# Patient Record
Sex: Male | Born: 1960 | Race: Black or African American | Hispanic: No | Smoking: Never smoker
Health system: Southern US, Community
[De-identification: ages and names within clinical notes are randomized; demographics above are authoritative.]

## PROBLEM LIST (undated history)

## (undated) DIAGNOSIS — K859 Acute pancreatitis without necrosis or infection, unspecified: Secondary | ICD-10-CM

## (undated) DIAGNOSIS — E119 Type 2 diabetes mellitus without complications: Secondary | ICD-10-CM

## (undated) DIAGNOSIS — K861 Other chronic pancreatitis: Secondary | ICD-10-CM

## (undated) DIAGNOSIS — K219 Gastro-esophageal reflux disease without esophagitis: Secondary | ICD-10-CM

## (undated) HISTORY — DX: Other chronic pancreatitis: K86.1

## (undated) HISTORY — PX: BACK SURGERY: SHX140

---

## 1996-02-29 ENCOUNTER — Encounter: Payer: Self-pay | Admitting: Internal Medicine

## 2000-02-06 ENCOUNTER — Emergency Department (HOSPITAL_COMMUNITY): Admission: EM | Admit: 2000-02-06 | Discharge: 2000-02-06 | Payer: Self-pay | Admitting: Emergency Medicine

## 2005-12-25 ENCOUNTER — Ambulatory Visit: Payer: Self-pay | Admitting: Internal Medicine

## 2006-03-24 ENCOUNTER — Ambulatory Visit: Payer: Self-pay | Admitting: Internal Medicine

## 2006-03-30 ENCOUNTER — Ambulatory Visit: Payer: Self-pay | Admitting: Internal Medicine

## 2007-03-30 ENCOUNTER — Encounter: Payer: Self-pay | Admitting: *Deleted

## 2007-03-30 DIAGNOSIS — K219 Gastro-esophageal reflux disease without esophagitis: Secondary | ICD-10-CM

## 2007-03-30 DIAGNOSIS — M722 Plantar fascial fibromatosis: Secondary | ICD-10-CM | POA: Insufficient documentation

## 2007-03-30 DIAGNOSIS — E7800 Pure hypercholesterolemia, unspecified: Secondary | ICD-10-CM

## 2007-07-08 ENCOUNTER — Encounter (INDEPENDENT_AMBULATORY_CARE_PROVIDER_SITE_OTHER): Payer: Self-pay | Admitting: *Deleted

## 2007-07-08 ENCOUNTER — Emergency Department (HOSPITAL_COMMUNITY): Admission: EM | Admit: 2007-07-08 | Discharge: 2007-07-08 | Payer: Self-pay | Admitting: Emergency Medicine

## 2007-12-07 ENCOUNTER — Ambulatory Visit (HOSPITAL_COMMUNITY): Admission: RE | Admit: 2007-12-07 | Discharge: 2007-12-08 | Payer: Self-pay | Admitting: Neurosurgery

## 2008-07-31 ENCOUNTER — Ambulatory Visit: Payer: Self-pay | Admitting: Internal Medicine

## 2008-07-31 DIAGNOSIS — I10 Essential (primary) hypertension: Secondary | ICD-10-CM | POA: Insufficient documentation

## 2008-07-31 DIAGNOSIS — E785 Hyperlipidemia, unspecified: Secondary | ICD-10-CM | POA: Insufficient documentation

## 2008-07-31 DIAGNOSIS — J309 Allergic rhinitis, unspecified: Secondary | ICD-10-CM | POA: Insufficient documentation

## 2008-07-31 DIAGNOSIS — K5289 Other specified noninfective gastroenteritis and colitis: Secondary | ICD-10-CM | POA: Insufficient documentation

## 2009-02-03 ENCOUNTER — Ambulatory Visit: Payer: Self-pay | Admitting: Diagnostic Radiology

## 2009-02-03 ENCOUNTER — Ambulatory Visit (HOSPITAL_COMMUNITY): Admission: RE | Admit: 2009-02-03 | Discharge: 2009-02-03 | Payer: Self-pay | Admitting: Emergency Medicine

## 2009-02-03 ENCOUNTER — Encounter (INDEPENDENT_AMBULATORY_CARE_PROVIDER_SITE_OTHER): Payer: Self-pay | Admitting: *Deleted

## 2009-02-03 ENCOUNTER — Emergency Department (HOSPITAL_BASED_OUTPATIENT_CLINIC_OR_DEPARTMENT_OTHER): Admission: EM | Admit: 2009-02-03 | Discharge: 2009-02-03 | Payer: Self-pay | Admitting: Emergency Medicine

## 2009-02-04 ENCOUNTER — Inpatient Hospital Stay (HOSPITAL_COMMUNITY): Admission: EM | Admit: 2009-02-04 | Discharge: 2009-02-06 | Payer: Self-pay | Admitting: Emergency Medicine

## 2009-02-04 ENCOUNTER — Encounter (INDEPENDENT_AMBULATORY_CARE_PROVIDER_SITE_OTHER): Payer: Self-pay | Admitting: *Deleted

## 2009-02-04 ENCOUNTER — Ambulatory Visit: Payer: Self-pay | Admitting: Internal Medicine

## 2009-02-07 ENCOUNTER — Telehealth (INDEPENDENT_AMBULATORY_CARE_PROVIDER_SITE_OTHER): Payer: Self-pay | Admitting: *Deleted

## 2009-02-12 ENCOUNTER — Telehealth: Payer: Self-pay | Admitting: Internal Medicine

## 2009-02-14 ENCOUNTER — Ambulatory Visit: Payer: Self-pay | Admitting: Internal Medicine

## 2009-02-14 DIAGNOSIS — K861 Other chronic pancreatitis: Secondary | ICD-10-CM | POA: Insufficient documentation

## 2009-02-14 DIAGNOSIS — R252 Cramp and spasm: Secondary | ICD-10-CM | POA: Insufficient documentation

## 2009-02-14 HISTORY — DX: Other chronic pancreatitis: K86.1

## 2009-02-14 LAB — CONVERTED CEMR LAB
Cholesterol, target level: 200 mg/dL
HDL goal, serum: 40 mg/dL
LDL Goal: 130 mg/dL

## 2009-03-26 ENCOUNTER — Ambulatory Visit: Payer: Self-pay | Admitting: Internal Medicine

## 2009-03-27 ENCOUNTER — Telehealth (INDEPENDENT_AMBULATORY_CARE_PROVIDER_SITE_OTHER): Payer: Self-pay | Admitting: *Deleted

## 2009-03-27 ENCOUNTER — Encounter: Payer: Self-pay | Admitting: Gastroenterology

## 2009-04-12 ENCOUNTER — Ambulatory Visit: Payer: Self-pay | Admitting: Gastroenterology

## 2009-04-12 ENCOUNTER — Ambulatory Visit (HOSPITAL_COMMUNITY): Admission: RE | Admit: 2009-04-12 | Discharge: 2009-04-12 | Payer: Self-pay | Admitting: Gastroenterology

## 2009-04-13 ENCOUNTER — Encounter: Payer: Self-pay | Admitting: Gastroenterology

## 2009-04-13 ENCOUNTER — Telehealth (INDEPENDENT_AMBULATORY_CARE_PROVIDER_SITE_OTHER): Payer: Self-pay | Admitting: *Deleted

## 2009-04-26 ENCOUNTER — Ambulatory Visit (HOSPITAL_COMMUNITY): Admission: RE | Admit: 2009-04-26 | Discharge: 2009-04-26 | Payer: Self-pay | Admitting: Gastroenterology

## 2009-04-26 ENCOUNTER — Encounter: Payer: Self-pay | Admitting: Gastroenterology

## 2009-06-22 ENCOUNTER — Telehealth (INDEPENDENT_AMBULATORY_CARE_PROVIDER_SITE_OTHER): Payer: Self-pay | Admitting: *Deleted

## 2010-10-12 LAB — COMPREHENSIVE METABOLIC PANEL
ALT: 17 U/L (ref 0–53)
ALT: 19 U/L (ref 0–53)
AST: 14 U/L (ref 0–37)
AST: 15 U/L (ref 0–37)
AST: 16 U/L (ref 0–37)
Albumin: 3.8 g/dL (ref 3.5–5.2)
BUN: 9 mg/dL (ref 6–23)
CO2: 27 mEq/L (ref 19–32)
CO2: 29 mEq/L (ref 19–32)
CO2: 31 mEq/L (ref 19–32)
Calcium: 8.9 mg/dL (ref 8.4–10.5)
Calcium: 8.9 mg/dL (ref 8.4–10.5)
Chloride: 103 mEq/L (ref 96–112)
Chloride: 105 mEq/L (ref 96–112)
Chloride: 105 mEq/L (ref 96–112)
Creatinine, Ser: 1.04 mg/dL (ref 0.4–1.5)
Creatinine, Ser: 1.08 mg/dL (ref 0.4–1.5)
GFR calc non Af Amer: >60 mL/min (ref >60)
GFR calc non Af Amer: >60 mL/min (ref >60)
GFR calc non Af Amer: >60 mL/min (ref >60)
Glucose, Bld: 101 mg/dL — ABNORMAL HIGH (ref 70–99)
Glucose, Bld: 94 mg/dL (ref 70–99)
Potassium: 4.3 mEq/L (ref 3.5–5.1)
Sodium: 138 mEq/L (ref 135–145)
Sodium: 139 mEq/L (ref 135–145)
Total Bilirubin: 0.7 mg/dL (ref 0.3–1.2)
Total Bilirubin: 0.9 mg/dL (ref 0.3–1.2)
Total Bilirubin: 1.4 mg/dL — ABNORMAL HIGH (ref 0.3–1.2)

## 2010-10-12 LAB — CBC
HCT: 40.2 % (ref 39.0–52.0)
HCT: 40.9 % (ref 39.0–52.0)
Hemoglobin: 13.3 g/dL (ref 13.0–17.0)
MCV: 86 fL (ref 78.0–100.0)
MCV: 86.3 fL (ref 78.0–100.0)
Platelets: 199 10*3/uL (ref 150–400)
Platelets: 209 10*3/uL (ref 150–400)
RBC: 4.66 MIL/uL (ref 4.22–5.81)
RBC: 4.76 MIL/uL (ref 4.22–5.81)
RDW: 13.3 % (ref 11.5–15.5)
WBC: 7.1 10*3/uL (ref 4.0–10.5)
WBC: 9 10*3/uL (ref 4.0–10.5)
WBC: 9.4 10*3/uL (ref 4.0–10.5)

## 2010-10-12 LAB — POCT CARDIAC MARKERS
CKMB, poc: 1.3 ng/mL (ref 1.0–8.0)
Myoglobin, poc: 64.6 ng/mL (ref 12–200)

## 2010-10-12 LAB — LIPASE, BLOOD: Lipase: 50 U/L (ref 11–59)

## 2010-10-12 LAB — LIPID PANEL

## 2010-10-13 LAB — DIFFERENTIAL
Eosinophils Relative: 3 % (ref 0–5)
Lymphocytes Relative: 26 % (ref 12–46)
Lymphs Abs: 2.2 10*3/uL (ref 0.7–4.0)
Monocytes Absolute: 0.5 10*3/uL (ref 0.1–1.0)

## 2010-10-13 LAB — CBC
MCV: 85.4 fL (ref 78.0–100.0)
Platelets: 214 10*3/uL (ref 150–400)
WBC: 8.6 10*3/uL (ref 4.0–10.5)

## 2010-10-13 LAB — POCT CARDIAC MARKERS
CKMB, poc: 1.9 ng/mL (ref 1.0–8.0)
Troponin i, poc: <0.05 ng/mL (ref 0.00–0.09)

## 2010-10-13 LAB — COMPREHENSIVE METABOLIC PANEL
ALT: 18 U/L (ref 0–53)
AST: 21 U/L (ref 0–37)
Alkaline Phosphatase: 71 U/L (ref 39–117)
CO2: 28 mEq/L (ref 19–32)
Calcium: 9.7 mg/dL (ref 8.4–10.5)
GFR calc non Af Amer: >60 mL/min (ref >60)
Potassium: 4.3 mEq/L (ref 3.5–5.1)
Sodium: 141 mEq/L (ref 135–145)

## 2010-10-13 LAB — URINALYSIS, ROUTINE W REFLEX MICROSCOPIC
Hgb urine dipstick: NEGATIVE
Nitrite: NEGATIVE
Specific Gravity, Urine: 1.029 (ref 1.005–1.030)
Urobilinogen, UA: 1 mg/dL (ref 0.0–1.0)
pH: 5.5 (ref 5.0–8.0)

## 2010-10-13 LAB — URINE CULTURE: Colony Count: NO GROWTH

## 2010-11-19 NOTE — Op Note (Signed)
[name], Hunter Jimenez               ACCOUNT NO.:  [id number]   MEDICAL RECORD NO.:  [id number]          PATIENT TYPE:  OIB   LOCATION:  3537                         FACILITY:  [facility]   PHYSICIAN:  Reinaldo Meeker, M.D. DATE OF BIRTH:  Jan 05, 1961   DATE OF PROCEDURE:  12/07/2007  DATE OF DISCHARGE:                               OPERATIVE REPORT   PREOPERATIVE DIAGNOSIS:  Herniated disk, L5-S1, right.   POSTOPERATIVE DIAGNOSIS:  Herniated disk, L5-S1, right.   PROCEDURE:  L5-S1 interlaminar laminotomy for excision of herniated disk  with operating microscope.   SECONDARY PROCEDURE:  Microdissection, L5-S1 disk and S1 nerve root.   SURGEON:  Reinaldo Meeker, M.D.   ASSISTANT:  Kathaleen Maser. Pool, M.D.   PROCEDURE IN DETAIL:  After placed in the prone position, the patient's  back was prepped and draped in the usual sterile fashion.  Localizing x-  ray was taken prior to incision to identify the appropriate level.  Midline incision was made above the spinous processes of L5 and S1.  Using Bovie cutting current, the incision was carried down the spinous  processes.  Subperiosteal dissection was then carried out on the right  side of the spinous processes and lamina, and self-retaining retractor  was placed for exposure.  X-ray showed approach to the appropriate  level.  Using a high-speed drill, the inferior one-third of the L5  lamina, medial one-third of the facet joint, and the superior one-third  of the S1 lamina were removed.  Residual bone and ligamentum flavum were  removed in a piecemeal fashion.  The microscope was draped, brought into  the field, and used for the remainder of the case.  Using  microdissection technique, the lateral aspect of the thecal sac and S1  nerve root were identified.  Further coagulation was carried out down to  the floor of the canal to identify the L5-S1 disk, which was found to be  remarkably herniated.  After coagulating on the annulus, fascia was  incised with a 15 blade.  Using pituitary rongeurs and curettes,  thorough disk space clean-out was carried out.  At the same time, great  care was taken to avoid injury to the neural elements, and this was  successfully done.  At this point, inspection was carried out in all  directions for any evidence of residual compression, and none could be  identified.  Large amounts of irrigation were carried out, and any  bleeding controlled with bipolar coagulation and Gelfoam.  The wound was  then closed in multiple layers of Vicryl on the muscle fascia and  subcutaneous and subcuticular tissues, and staples were placed on the  skin.  Sterile dressing was then applied, and the patient was then  extubated and taken to recovery room in stable condition.           ______________________________  Reinaldo Meeker, M.D.     ROK/MEDQ  D:  12/07/2007  T:  12/08/2007  Job:  [id number]

## 2010-11-19 NOTE — Discharge Summary (Signed)
NAMEDAEMIAN, GAHM               ACCOUNT NO.:  [id number]   MEDICAL RECORD NO.:  [id number]          PATIENT TYPE:  INP   LOCATION:  1513                         [facility]ACILITY[facility]  [facility]   PHYSICIAN:  Georgina Quint. Plotnikov, MDDATE OF BIRTH:  [birth year]   DATE OF ADMISSION:  02/04/2009  DATE OF DISCHARGE:  02/06/2009                               DISCHARGE SUMMARY   DISCHARGE DIAGNOSES:  1. Acute pancreatitis, unclear etiology.  Differential etiology      includes high fat diet versus angiotensin-converting enzyme      inhibitor side effect versus other.  Common causes of acute      pancreatitis were ruled out by history and tests.  May need      gastrointestinal consult if symptoms recur.  2. Abdominal pain due to problem #1, improved.  3. Hypertension.  4. Gastroesophageal reflux disease.   DISCHARGE MEDICATIONS:  1. Cozaar 100 mg daily.  2. Omeprazole 20 mg daily.  3. Vitamin D3 1000 units daily, start in 1 week.   SPECIAL INSTRUCTIONS:  Stop taking lisinopril.  Call if problems.   DISCHARGE DIET:  Low-sodium and low-fat diet.   FOLLOW-UP PLANS:  Dr. Jonny Ruiz next week.  Return to work to be established  by Dr. Jonny Ruiz next week.   HISTORY:  The patient is a 50 year old male who presented to [facility] with severe epigastric pain radiating to his back of 24-48 hours'  duration.  There was no nausea, vomiting.  For the details, please see  history and physical on February 04, 2009.   HOSPITAL COURSE:  During the course of hospitalization, the patient was  kept n.p.o. on IV fluids.  His problems were managed symptomatically.  His condition has improved.  He was able to tolerate diet, and had a  bowel movement on the day of discharge.   On the day of discharge his blood pressure was 104/63, heart rate 64,  respirations 18, temp 98.6, T-max 98, saturations 100% on room air.  ABDOMEN:  Soft, nontender, slightly sensitive in the epigastric area.  HEENT:  With moist mucosa.  HEART:   Regular.  LUNGS:  Clear.  LOWER EXTREMITIES:  Without edema.  He is alert, oriented, and cooperative.   LABORATORIES ON THE DAY OF DISCHARGE:  His hemoglobin is 13.3, white  count 7.1.  AST, ALT normal.  Alk phos 53, Sodium 139, potassium 4.0  His EKG showed normal sinus rhythm.  His abdominal ultrasound was  normal.  Cardiac markers negative.  Lipase 71.  CBC normal.  Glucose  103, sodium 138, potassium 4.3, creatinine 1.08.  On admission his  lipase was 378, rechecked 71.  His triglycerides were 71.  CT of the  abdomen and pelvis was normal.      Aleksei V. Plotnikov, MD  Electronically Signed     AVP/MEDQ  D:  02/06/2009  T:  02/06/2009  Job:  [id number]   cc:   Corwin Levins, MD  [address]  [place]  [place] [address]

## 2011-03-27 LAB — COMPREHENSIVE METABOLIC PANEL
ALT: 27
Alkaline Phosphatase: 70
CO2: 29
Chloride: 102
GFR calc non Af Amer: >60
Glucose, Bld: 92
Potassium: 3.7
Sodium: 140
Total Bilirubin: 1.1
Total Protein: 6.9

## 2011-03-27 LAB — CBC
Hemoglobin: 13.6
RBC: 4.94
WBC: 8

## 2011-03-27 LAB — URINALYSIS, ROUTINE W REFLEX MICROSCOPIC
Glucose, UA: NEGATIVE
Nitrite: NEGATIVE
Specific Gravity, Urine: 1.02
pH: 7

## 2011-03-27 LAB — DIFFERENTIAL
Basophils Relative: 1
Eosinophils Absolute: 0.2
Monocytes Relative: 5
Neutrophils Relative %: 70

## 2011-04-02 LAB — CBC
MCV: 83.6
Platelets: 276
WBC: 8.3

## 2013-09-18 ENCOUNTER — Emergency Department (HOSPITAL_COMMUNITY): Payer: PRIVATE HEALTH INSURANCE

## 2013-09-18 ENCOUNTER — Emergency Department (HOSPITAL_COMMUNITY)
Admission: EM | Admit: 2013-09-18 | Discharge: 2013-09-19 | Disposition: A | Discharge status: Home / Self Care | Payer: PRIVATE HEALTH INSURANCE | Attending: Emergency Medicine | Admitting: Emergency Medicine

## 2013-09-18 ENCOUNTER — Encounter (HOSPITAL_COMMUNITY): Payer: Self-pay | Admitting: Emergency Medicine

## 2013-09-18 DIAGNOSIS — E119 Type 2 diabetes mellitus without complications: Secondary | ICD-10-CM | POA: Insufficient documentation

## 2013-09-18 DIAGNOSIS — G8929 Other chronic pain: Secondary | ICD-10-CM | POA: Insufficient documentation

## 2013-09-18 DIAGNOSIS — Y9389 Activity, other specified: Secondary | ICD-10-CM | POA: Insufficient documentation

## 2013-09-18 DIAGNOSIS — S161XXA Strain of muscle, fascia and tendon at neck level, initial encounter: Secondary | ICD-10-CM

## 2013-09-18 DIAGNOSIS — Z9889 Other specified postprocedural states: Secondary | ICD-10-CM | POA: Insufficient documentation

## 2013-09-18 DIAGNOSIS — Y92410 Unspecified street and highway as the place of occurrence of the external cause: Secondary | ICD-10-CM | POA: Insufficient documentation

## 2013-09-18 DIAGNOSIS — K219 Gastro-esophageal reflux disease without esophagitis: Secondary | ICD-10-CM | POA: Insufficient documentation

## 2013-09-18 DIAGNOSIS — S139XXA Sprain of joints and ligaments of unspecified parts of neck, initial encounter: Secondary | ICD-10-CM | POA: Insufficient documentation

## 2013-09-18 DIAGNOSIS — Z79899 Other long term (current) drug therapy: Secondary | ICD-10-CM | POA: Insufficient documentation

## 2013-09-18 DIAGNOSIS — M549 Dorsalgia, unspecified: Secondary | ICD-10-CM | POA: Insufficient documentation

## 2013-09-18 DIAGNOSIS — Z794 Long term (current) use of insulin: Secondary | ICD-10-CM | POA: Insufficient documentation

## 2013-09-18 HISTORY — DX: Gastro-esophageal reflux disease without esophagitis: K21.9

## 2013-09-18 HISTORY — DX: Acute pancreatitis without necrosis or infection, unspecified: K85.90

## 2013-09-18 HISTORY — DX: Type 2 diabetes mellitus without complications: E11.9

## 2013-09-18 MED ORDER — ONDANSETRON HCL 4 MG/2ML IJ SOLN: 4.0000 mg | Freq: Once | INTRAMUSCULAR | Status: DC

## 2013-09-18 MED ORDER — DIAZEPAM 5 MG PO TABS
10.0000 mg | ORAL_TABLET | Freq: Once | ORAL | Status: AC
  Administered 2013-09-18: 10 mg via ORAL
  Filled 2013-09-18: qty 2

## 2013-09-18 MED ORDER — ONDANSETRON 4 MG PO TBDP
8.0000 mg | ORAL_TABLET | Freq: Once | ORAL | Status: AC
  Administered 2013-09-18: 8 mg via ORAL
  Filled 2013-09-18: qty 2

## 2013-09-18 MED ORDER — HYDROCODONE-ACETAMINOPHEN 5-325 MG PO TABS
2.0000 | ORAL_TABLET | Freq: Once | ORAL | Status: AC
  Administered 2013-09-18: 2 via ORAL
  Filled 2013-09-18: qty 2
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
## 2013-09-18 NOTE — ED Notes (Signed)
Report given to Anna, RN 

## 2013-09-19 MED ORDER — METHOCARBAMOL 500 MG PO TABS: 500.0000 mg | ORAL_TABLET | Freq: Two times a day (BID) | ORAL | Status: AC

## 2013-09-19 NOTE — ED Provider Notes (Signed)
Medical screening examination/treatment/procedure(s) were performed by non-physician practitioner and as supervising physician I was immediately available for consultation/collaboration.   EKG Interpretation None       Pansy Ostrovsky, MD 09/19/13 0737 
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
## 2013-09-19 NOTE — Discharge Instructions (Signed)
1. Medications: robaxin, usual home medications 2. Treatment: rest, drink plenty of fluids, ibuprofen 800mg  three times per day for 5 days - with food 3. Follow Up: Please followup with your primary doctor for discussion of your diagnoses and further evaluation after today's visit;    Cervical Sprain A cervical sprain is an injury in the neck in which the strong, fibrous tissues (ligaments) that connect your neck bones stretch or tear. Cervical sprains can range from mild to severe. Severe cervical sprains can cause the neck vertebrae to be unstable. This can lead to damage of the spinal cord and can result in serious nervous system problems. The amount of time it takes for a cervical sprain to get better depends on the cause and extent of the injury. Most cervical sprains heal in 1 to 3 weeks. CAUSES  Severe cervical sprains may be caused by:   Contact sport injuries (such as from football, rugby, wrestling, hockey, auto racing, gymnastics, diving, martial arts, or boxing).   Motor vehicle collisions.   Whiplash injuries. This is an injury from a sudden forward-and backward whipping movement of the head and neck.  Falls.  Mild cervical sprains may be caused by:   Being in an awkward position, such as while cradling a telephone between your ear and shoulder.   Sitting in a chair that does not offer proper support.   Working at a poorly Marketing executive station.   Looking up or down for long periods of time.  SYMPTOMS   Pain, soreness, stiffness, or a burning sensation in the front, back, or sides of the neck. This discomfort may develop immediately after the injury or slowly, 24 hours or more after the injury.   Pain or tenderness directly in the middle of the back of the neck.   Shoulder or upper back pain.   Limited ability to move the neck.   Headache.   Dizziness.   Weakness, numbness, or tingling in the hands or arms.   Muscle spasms.   Difficulty  swallowing or chewing.   Tenderness and swelling of the neck.  DIAGNOSIS  Most of the time your health care provider can diagnose a cervical sprain by taking your history and doing a physical exam. Your health care provider will ask about previous neck injuries and any known neck problems, such as arthritis in the neck. X-rays may be taken to find out if there are any other problems, such as with the bones of the neck. Other tests, such as a CT scan or MRI, may also be needed.  TREATMENT  Treatment depends on the severity of the cervical sprain. Mild sprains can be treated with rest, keeping the neck in place (immobilization), and pain medicines. Severe cervical sprains are immediately immobilized. Further treatment is done to help with pain, muscle spasms, and other symptoms and may include:  Medicines, such as pain relievers, numbing medicines, or muscle relaxants.   Physical therapy. This may involve stretching exercises, strengthening exercises, and posture training. Exercises and improved posture can help stabilize the neck, strengthen muscles, and help stop symptoms from returning.  HOME CARE INSTRUCTIONS   Put ice on the injured area.   Put ice in a plastic bag.   Place a towel between your skin and the bag.   Leave the ice on for 15 20 minutes, 3 4 times a day.   If your injury was severe, you may have been given a cervical collar to wear. A cervical collar is a two-piece collar  designed to keep your neck from moving while it heals.  Do not remove the collar unless instructed by your health care provider.  If you have long hair, keep it outside of the collar.  Ask your health care provider before making any adjustments to your collar. Minor adjustments may be required over time to improve comfort and reduce pressure on your chin or on the back of your head.  Ifyou are allowed to remove the collar for cleaning or bathing, follow your health care provider's instructions on  how to do so safely.  Keep your collar clean by wiping it with mild soap and water and drying it completely. If the collar you have been given includes removable pads, remove them every 1 2 days and hand wash them with soap and water. Allow them to air dry. They should be completely dry before you wear them in the collar.  If you are allowed to remove the collar for cleaning and bathing, wash and dry the skin of your neck. Check your skin for irritation or sores. If you see any, tell your health care provider.  Do not drive while wearing the collar.   Only take over-the-counter or prescription medicines for pain, discomfort, or fever as directed by your health care provider.   Keep all follow-up appointments as directed by your health care provider.   Keep all physical therapy appointments as directed by your health care provider.   Make any needed adjustments to your workstation to promote good posture.   Avoid positions and activities that make your symptoms worse.   Warm up and stretch before being active to help prevent problems.  SEEK MEDICAL CARE IF:   Your pain is not controlled with medicine.   You are unable to decrease your pain medicine over time as planned.   Your activity level is not improving as expected.  SEEK IMMEDIATE MEDICAL CARE IF:   You develop any bleeding.  You develop stomach upset.  You have signs of an allergic reaction to your medicine.   Your symptoms get worse.   You develop new, unexplained symptoms.   You have numbness, tingling, weakness, or paralysis in any part of your body.  MAKE SURE YOU:   Understand these instructions.  Will watch your condition.  Will get help right away if you are not doing well or get worse. Document Released: 04/20/2007 Document Revised: 04/13/2013 Document Reviewed: 12/29/2012 [facility] Patient Information 2014 [employer], [place].

## 2013-09-22 ENCOUNTER — Encounter: Payer: Self-pay | Admitting: Internal Medicine

## 2013-09-22 ENCOUNTER — Ambulatory Visit (INDEPENDENT_AMBULATORY_CARE_PROVIDER_SITE_OTHER): Payer: PRIVATE HEALTH INSURANCE | Admitting: Internal Medicine

## 2013-09-22 VITALS — BP 138/76 | HR 107 | Temp 98.2°F | Wt 238.2 lb

## 2013-09-22 DIAGNOSIS — M542 Cervicalgia: Secondary | ICD-10-CM | POA: Insufficient documentation

## 2013-09-22 DIAGNOSIS — I10 Essential (primary) hypertension: Secondary | ICD-10-CM

## 2013-09-22 MED ORDER — PREDNISONE 10 MG PO TABS: ORAL_TABLET | ORAL | Status: DC

## 2013-09-22 MED ORDER — TRAMADOL HCL 50 MG PO TABS: 50.0000 mg | ORAL_TABLET | Freq: Four times a day (QID) | ORAL | Status: DC | PRN

## 2013-09-22 NOTE — Assessment & Plan Note (Signed)
stable overall by history and exam, recent data reviewed with pt, and pt to continue medical treatment as before,  to f/u any worsening symptoms or concerns BP Readings from Last 3 Encounters:  09/22/13 138/76  09/19/13 119/73  03/26/09 118/72

## 2013-09-22 NOTE — Assessment & Plan Note (Addendum)
Most likely c/w flare of spine related low cervical, possible DJD or DDD or severe MSK strain, but with symptoms suggestive of radiculitis, neuro exam no change; for tx tramadol prn, predpack asd, consider plain films but not likely to change initial tx; for MRI for persistent pain or worsening pain or neuro changes such as UE radiculopathy

## 2013-09-22 NOTE — Progress Notes (Signed)
Pre visit review using our clinic review tool, if applicable. No additional management support is needed unless otherwise documented below in the visit note. 

## 2013-09-22 NOTE — Patient Instructions (Signed)
Please take all new medication as prescribed - the pain medication, and the prednisone (anti-inflammatory)  Please continue all other medications as before, and refills have been done if requested. Please have the pharmacy call with any other refills you may need.  We can hold on MRI of neck at this time, but please return for any persistent or worsening neck pain, or extremity pain, numbness or weakness  You are given the work note today
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
## 2013-09-22 NOTE — Assessment & Plan Note (Signed)
O/w no signfiicant injury,  to f/u any worsening symptoms or concerns

## 2013-10-13 ENCOUNTER — Ambulatory Visit (INDEPENDENT_AMBULATORY_CARE_PROVIDER_SITE_OTHER): Payer: PRIVATE HEALTH INSURANCE | Admitting: Internal Medicine

## 2013-10-13 ENCOUNTER — Encounter: Payer: Self-pay | Admitting: Internal Medicine

## 2013-10-13 VITALS — BP 130/90 | HR 97 | Temp 98.5°F | Ht 69.5 in | Wt 239.0 lb

## 2013-10-13 DIAGNOSIS — I10 Essential (primary) hypertension: Secondary | ICD-10-CM

## 2013-10-13 DIAGNOSIS — M542 Cervicalgia: Secondary | ICD-10-CM

## 2013-10-13 MED ORDER — HYDROCODONE-ACETAMINOPHEN 5-325 MG PO TABS: 1.0000 | ORAL_TABLET | Freq: Four times a day (QID) | ORAL | Status: AC | PRN

## 2013-10-13 MED ORDER — CYCLOBENZAPRINE HCL 5 MG PO TABS: 5.0000 mg | ORAL_TABLET | Freq: Three times a day (TID) | ORAL | Status: AC | PRN

## 2013-10-13 NOTE — Patient Instructions (Signed)
OK to stop the tramadol  Please take all new medication as prescribed - the hydrocodone, and muscle relaxer as needed for pain  You will be contacted regarding the referral for: Physical Therapy  Please continue all other medications as before, and refills have been done if requested. Please have the pharmacy call with any other refills you may need.  Please fax or forward any lab work you have or get from the [facility] to [phone]
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
## 2013-10-13 NOTE — Progress Notes (Signed)
Pre visit review using our clinic review tool, if applicable. No additional management support is needed unless otherwise documented below in the visit note. 

## 2013-10-14 NOTE — Assessment & Plan Note (Signed)
Persistent, subjectively worse pain, exam without neuro change, for pain control, muscle relaxer , PT ,  to f/u any worsening symptoms or concerns

## 2013-10-14 NOTE — Assessment & Plan Note (Signed)
stable overall by history and exam, recent data reviewed with pt, and pt to continue medical treatment as before,  to f/u any worsening symptoms or concerns BP Readings from Last 3 Encounters:  10/13/13 130/90  09/22/13 138/76  09/19/13 119/73

## 2013-10-27 ENCOUNTER — Ambulatory Visit: Payer: PRIVATE HEALTH INSURANCE | Attending: Internal Medicine | Admitting: Rehabilitation

## 2013-10-27 DIAGNOSIS — IMO0001 Reserved for inherently not codable concepts without codable children: Secondary | ICD-10-CM | POA: Diagnosis present

## 2013-10-27 DIAGNOSIS — M542 Cervicalgia: Secondary | ICD-10-CM | POA: Insufficient documentation

## 2013-10-27 DIAGNOSIS — M546 Pain in thoracic spine: Secondary | ICD-10-CM | POA: Diagnosis not present

## 2013-11-03 ENCOUNTER — Ambulatory Visit: Payer: PRIVATE HEALTH INSURANCE | Admitting: Rehabilitation

## 2013-11-03 DIAGNOSIS — IMO0001 Reserved for inherently not codable concepts without codable children: Secondary | ICD-10-CM | POA: Diagnosis not present

## 2013-11-04 ENCOUNTER — Ambulatory Visit: Payer: PRIVATE HEALTH INSURANCE | Attending: Internal Medicine | Admitting: Rehabilitation

## 2013-11-04 DIAGNOSIS — M542 Cervicalgia: Secondary | ICD-10-CM | POA: Insufficient documentation

## 2013-11-04 DIAGNOSIS — M546 Pain in thoracic spine: Secondary | ICD-10-CM | POA: Insufficient documentation

## 2013-11-04 DIAGNOSIS — IMO0001 Reserved for inherently not codable concepts without codable children: Secondary | ICD-10-CM | POA: Insufficient documentation

## 2013-11-07 ENCOUNTER — Ambulatory Visit: Payer: PRIVATE HEALTH INSURANCE | Admitting: Rehabilitation

## 2013-11-07 DIAGNOSIS — IMO0001 Reserved for inherently not codable concepts without codable children: Secondary | ICD-10-CM | POA: Diagnosis not present

## 2013-11-09 ENCOUNTER — Ambulatory Visit: Payer: PRIVATE HEALTH INSURANCE | Admitting: Rehabilitation

## 2013-11-09 DIAGNOSIS — IMO0001 Reserved for inherently not codable concepts without codable children: Secondary | ICD-10-CM | POA: Diagnosis not present

## 2013-11-14 ENCOUNTER — Ambulatory Visit: Payer: PRIVATE HEALTH INSURANCE | Admitting: Rehabilitation

## 2013-11-14 DIAGNOSIS — IMO0001 Reserved for inherently not codable concepts without codable children: Secondary | ICD-10-CM | POA: Diagnosis not present

## 2013-11-17 ENCOUNTER — Ambulatory Visit: Payer: PRIVATE HEALTH INSURANCE | Admitting: Rehabilitation

## 2013-11-17 DIAGNOSIS — IMO0001 Reserved for inherently not codable concepts without codable children: Secondary | ICD-10-CM | POA: Diagnosis not present

## 2013-11-22 ENCOUNTER — Ambulatory Visit: Payer: PRIVATE HEALTH INSURANCE | Admitting: Rehabilitation

## 2013-11-22 DIAGNOSIS — IMO0001 Reserved for inherently not codable concepts without codable children: Secondary | ICD-10-CM | POA: Diagnosis not present

## 2013-11-24 ENCOUNTER — Ambulatory Visit: Payer: PRIVATE HEALTH INSURANCE | Admitting: Rehabilitation

## 2013-11-30 ENCOUNTER — Ambulatory Visit: Payer: PRIVATE HEALTH INSURANCE | Admitting: Rehabilitation

## 2013-12-01 ENCOUNTER — Ambulatory Visit (INDEPENDENT_AMBULATORY_CARE_PROVIDER_SITE_OTHER): Payer: PRIVATE HEALTH INSURANCE | Admitting: Internal Medicine

## 2013-12-01 ENCOUNTER — Ambulatory Visit: Payer: PRIVATE HEALTH INSURANCE | Admitting: Rehabilitation

## 2013-12-01 ENCOUNTER — Encounter: Payer: Self-pay | Admitting: Internal Medicine

## 2013-12-01 VITALS — BP 120/82 | HR 93 | Temp 98.4°F | Ht 69.0 in | Wt 247.8 lb

## 2013-12-01 DIAGNOSIS — M542 Cervicalgia: Secondary | ICD-10-CM

## 2013-12-01 DIAGNOSIS — I10 Essential (primary) hypertension: Secondary | ICD-10-CM

## 2013-12-01 DIAGNOSIS — IMO0001 Reserved for inherently not codable concepts without codable children: Secondary | ICD-10-CM | POA: Diagnosis not present

## 2013-12-01 DIAGNOSIS — H9202 Otalgia, left ear: Secondary | ICD-10-CM | POA: Insufficient documentation

## 2013-12-01 DIAGNOSIS — H9209 Otalgia, unspecified ear: Secondary | ICD-10-CM

## 2013-12-01 NOTE — Assessment & Plan Note (Signed)
Improved with conservative tx, minor discomfort residual at this time, exam benign, essentially resolved,  to f/u any worsening symptoms or concerns
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
## 2013-12-01 NOTE — Patient Instructions (Signed)
Please continue all other medications as before, and refills have been done if requested. Please have the pharmacy call with any other refills you may need.  Please keep your appointments with your specialists as you have planned  Please continue to monitor your Blood Pressure on a regular basis, with the goal being less than 140/90

## 2013-12-01 NOTE — Assessment & Plan Note (Signed)
stable overall by history and exam, recent data reviewed with pt, and pt to continue medical treatment as before,  to f/u any worsening symptoms or concerns BP Readings from Last 3 Encounters:  12/01/13 120/82  10/13/13 130/90  09/22/13 138/76
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
## 2013-12-01 NOTE — Progress Notes (Signed)
Pre visit review using our clinic review tool, if applicable. No additional management support is needed unless otherwise documented below in the visit note. 
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
